# Patient Record
Sex: Male | Born: 1960 | Race: White | Hispanic: No | Marital: Single | Smoking: Current some day smoker
Health system: Southern US, Community
[De-identification: ages and names within clinical notes are randomized; demographics above are authoritative.]

## PROBLEM LIST (undated history)

## (undated) DIAGNOSIS — J45909 Unspecified asthma, uncomplicated: Secondary | ICD-10-CM

## (undated) DIAGNOSIS — G4730 Sleep apnea, unspecified: Secondary | ICD-10-CM

## (undated) DIAGNOSIS — E119 Type 2 diabetes mellitus without complications: Secondary | ICD-10-CM

## (undated) DIAGNOSIS — M1990 Unspecified osteoarthritis, unspecified site: Secondary | ICD-10-CM

## (undated) DIAGNOSIS — I2510 Atherosclerotic heart disease of native coronary artery without angina pectoris: Secondary | ICD-10-CM

## (undated) DIAGNOSIS — I219 Acute myocardial infarction, unspecified: Secondary | ICD-10-CM

## (undated) DIAGNOSIS — I10 Essential (primary) hypertension: Secondary | ICD-10-CM

## (undated) DIAGNOSIS — J449 Chronic obstructive pulmonary disease, unspecified: Secondary | ICD-10-CM

## (undated) HISTORY — PX: CORONARY ARTERY BYPASS GRAFT: SHX141

---

## 2005-04-29 ENCOUNTER — Inpatient Hospital Stay: Payer: Self-pay | Admitting: Internal Medicine

## 2005-04-29 ENCOUNTER — Other Ambulatory Visit: Payer: Self-pay

## 2005-04-30 ENCOUNTER — Other Ambulatory Visit: Payer: Self-pay

## 2005-06-27 ENCOUNTER — Ambulatory Visit: Payer: Self-pay | Admitting: Internal Medicine

## 2010-06-23 ENCOUNTER — Inpatient Hospital Stay: Payer: Self-pay | Admitting: Internal Medicine

## 2011-06-27 ENCOUNTER — Emergency Department: Payer: Self-pay | Admitting: Emergency Medicine

## 2011-06-27 LAB — COMPREHENSIVE METABOLIC PANEL
Alkaline Phosphatase: 44 U/L — ABNORMAL LOW (ref 50–136)
Anion Gap: 8 (ref 7–16)
Co2: 27 mmol/L (ref 21–32)
Creatinine: 1.05 mg/dL (ref 0.60–1.30)
EGFR (African American): >60
EGFR (Non-African Amer.): >60
Glucose: 117 mg/dL — ABNORMAL HIGH (ref 65–99)
Osmolality: 283 (ref 275–301)
SGOT(AST): 17 U/L (ref 15–37)
SGPT (ALT): 22 U/L
Sodium: 141 mmol/L (ref 136–145)

## 2011-06-27 LAB — CBC
HCT: 44.4 % (ref 40.0–52.0)
MCH: 32.3 pg (ref 26.0–34.0)
Platelet: 221 10*3/uL (ref 150–440)
RDW: 12.4 % (ref 11.5–14.5)
WBC: 7.1 10*3/uL (ref 3.8–10.6)

## 2011-06-27 LAB — TROPONIN I: Troponin-I: <0.02 ng/mL

## 2011-09-04 ENCOUNTER — Emergency Department: Payer: Self-pay | Admitting: *Deleted

## 2011-09-04 LAB — BASIC METABOLIC PANEL
Anion Gap: 5 — ABNORMAL LOW (ref 7–16)
Co2: 29 mmol/L (ref 21–32)
EGFR (African American): >60
Glucose: 188 mg/dL — ABNORMAL HIGH (ref 65–99)
Osmolality: 280 (ref 275–301)
Sodium: 138 mmol/L (ref 136–145)

## 2011-09-04 LAB — CBC
HCT: 43.8 % (ref 40.0–52.0)
MCH: 32.1 pg (ref 26.0–34.0)
MCV: 95 fL (ref 80–100)
RBC: 4.64 10*6/uL (ref 4.40–5.90)
RDW: 12.9 % (ref 11.5–14.5)
WBC: 6.6 10*3/uL (ref 3.8–10.6)

## 2011-09-04 LAB — TROPONIN I: Troponin-I: <0.02 ng/mL

## 2011-09-04 LAB — CK TOTAL AND CKMB (NOT AT ARMC): CK, Total: 112 U/L (ref 35–232)

## 2011-09-04 LAB — PRO B NATRIURETIC PEPTIDE: B-Type Natriuretic Peptide: 92 pg/mL (ref 0–125)

## 2012-06-11 IMAGING — CT CT CHEST W/ CM
1 series · 15 of 33 positions shown, 19 images · IV contrast (APPLIED)
Comparison: none

REASON FOR EXAM: hemoptysis, dyspnea, cough
COMMENTS:

[Series 7: soft tissue · axial · 0.82mm/px · z∈[-437,-137]mm · 15 of 118 slices shown, 19 images]
[im 9/118  mediastinal]
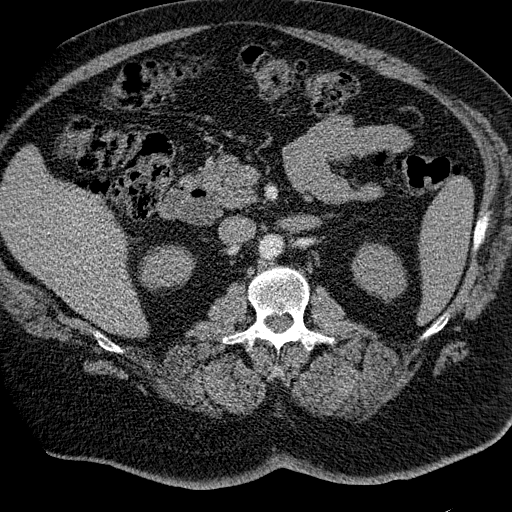
[im 9/118  lung]
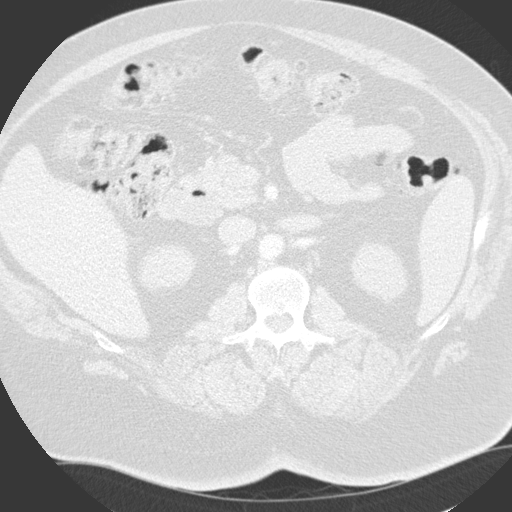
[im 18/118  lung]
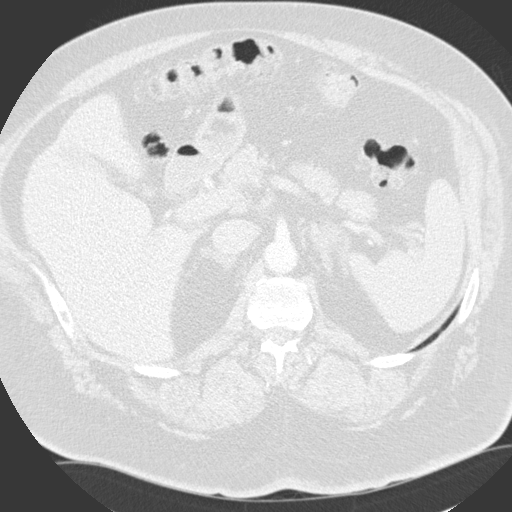
[im 24/118  lung]
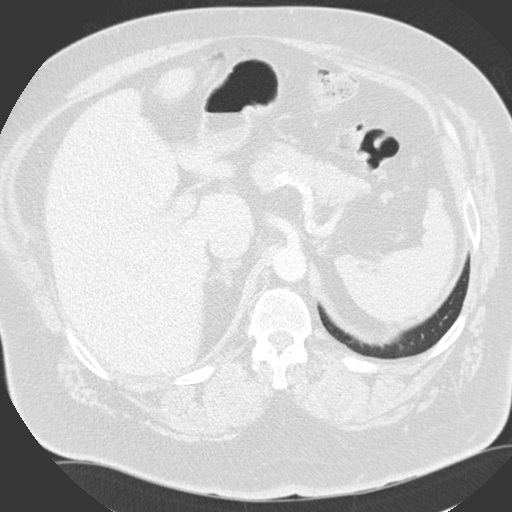
[im 31/118  lung]
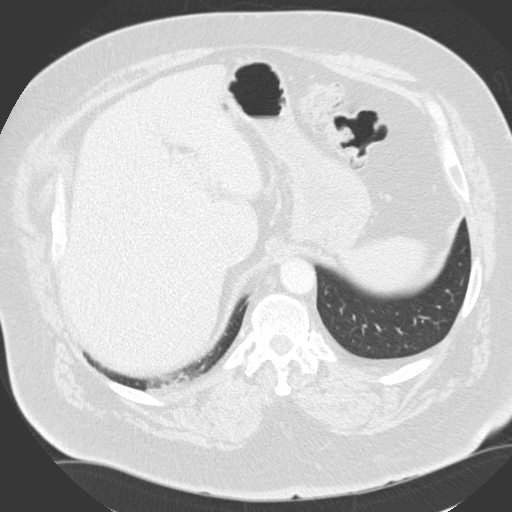
[im 40/118  mediastinal]
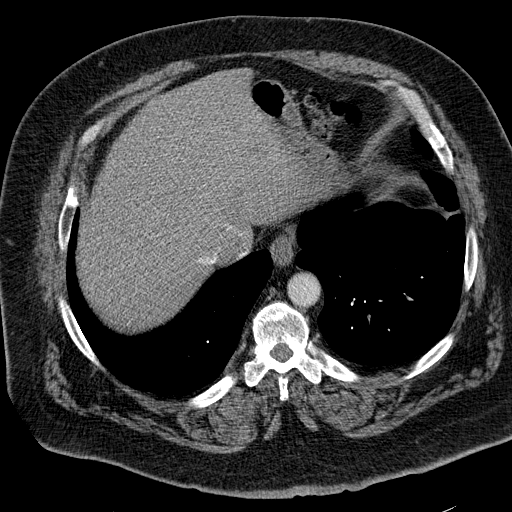
[im 40/118  lung]
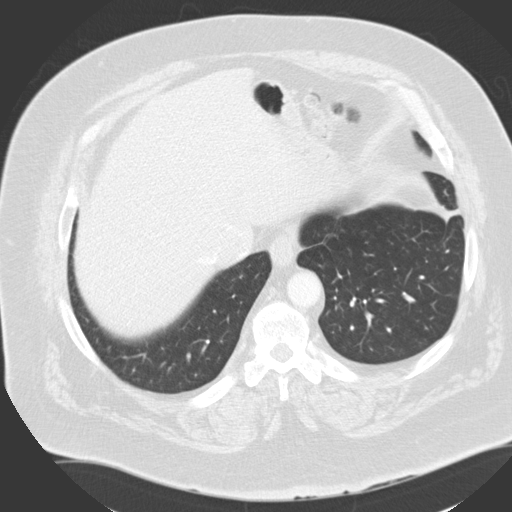
[im 47/118  lung]
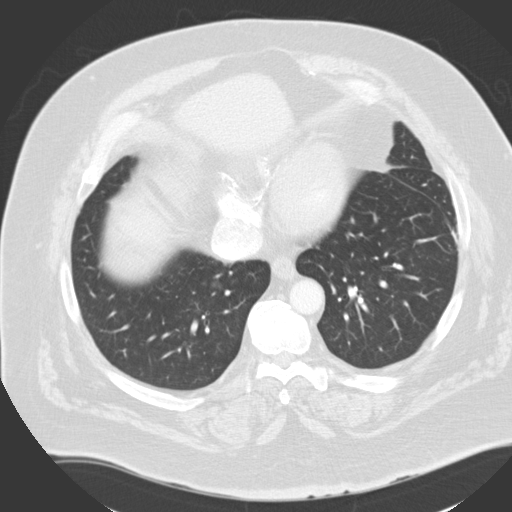
[im 53/118  lung]
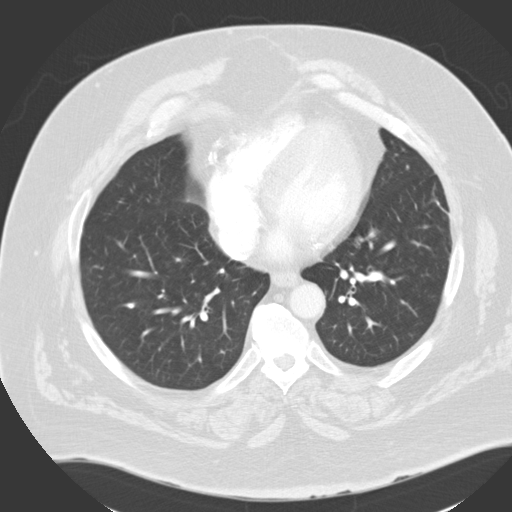
[im 61/118  lung]
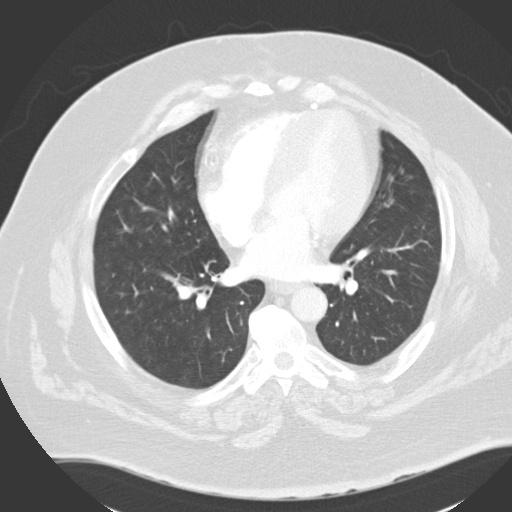
[im 66/118  mediastinal]
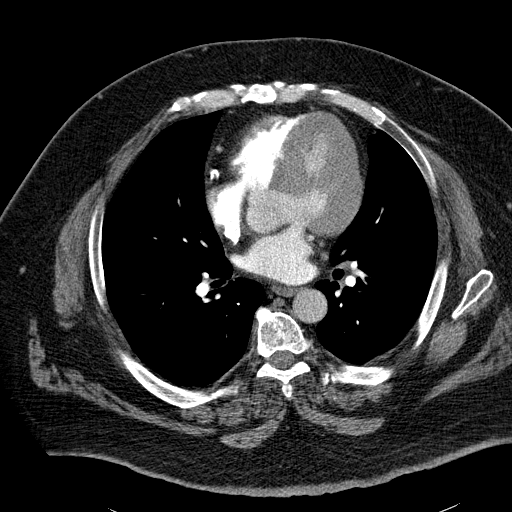
[im 66/118  lung]
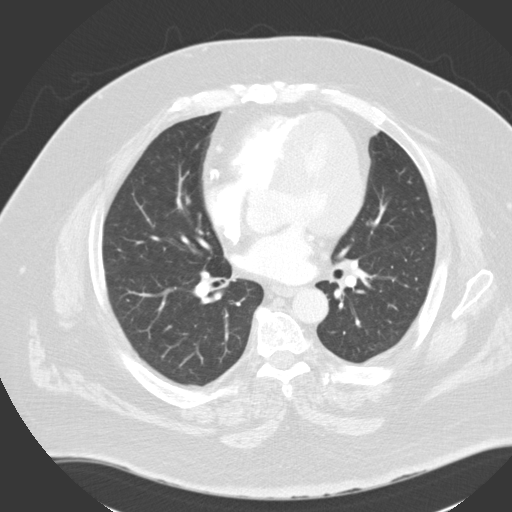
[im 71/118  lung]
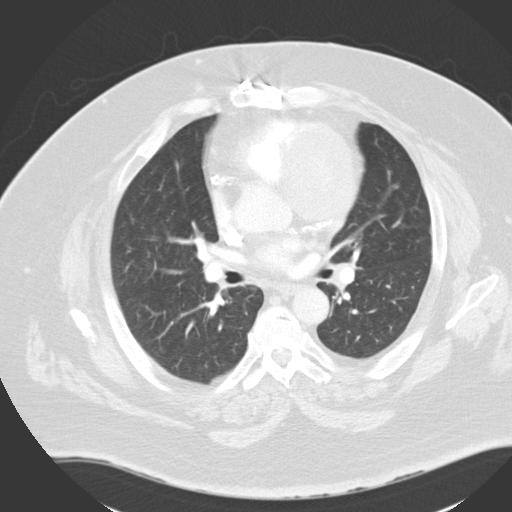
[im 79/118  lung]
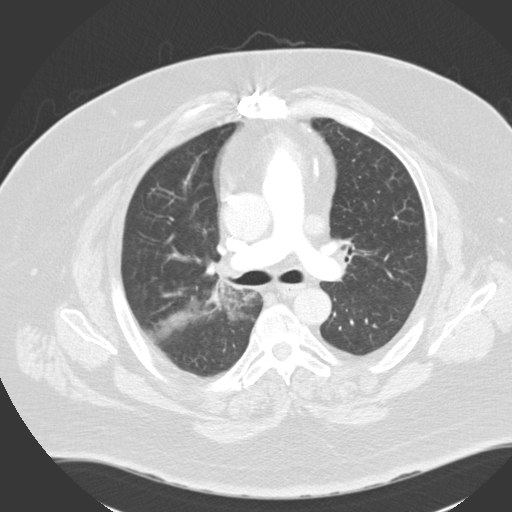
[im 87/118  lung]
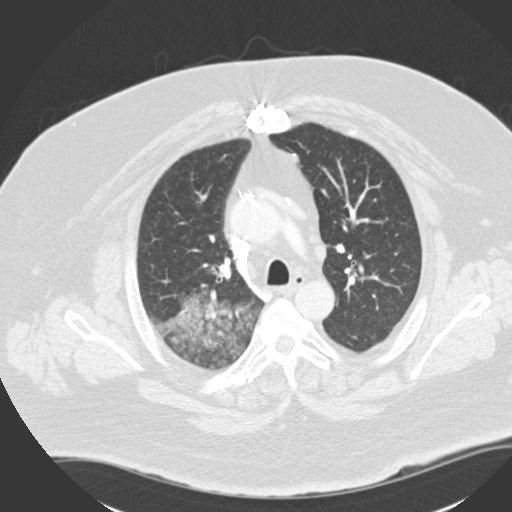
[im 94/118  mediastinal]
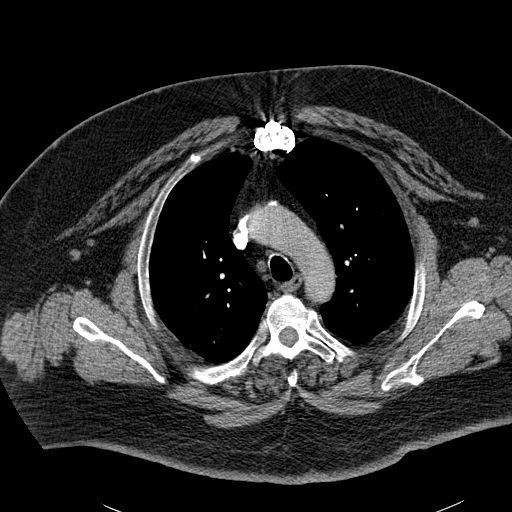
[im 94/118  lung]
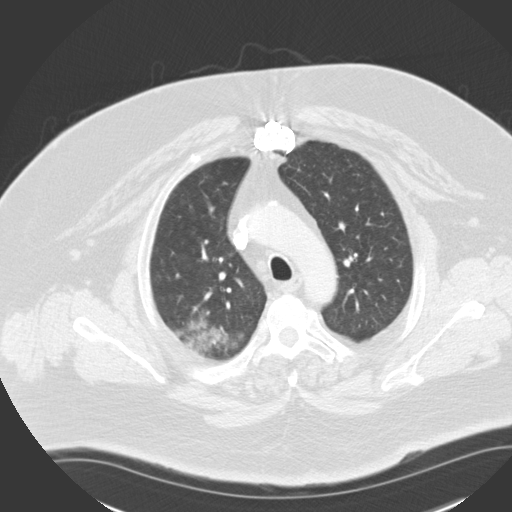
[im 100/118  lung]
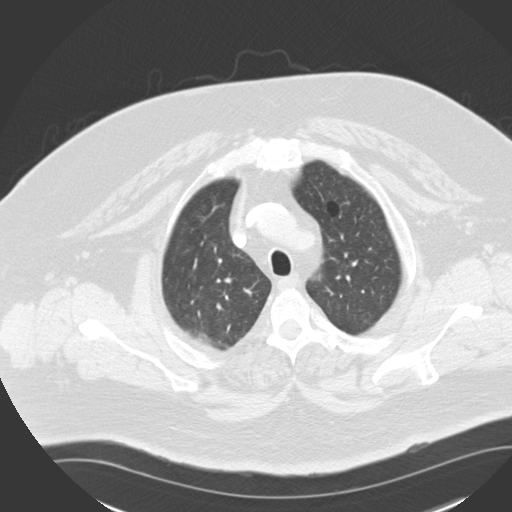
[im 109/118  lung]
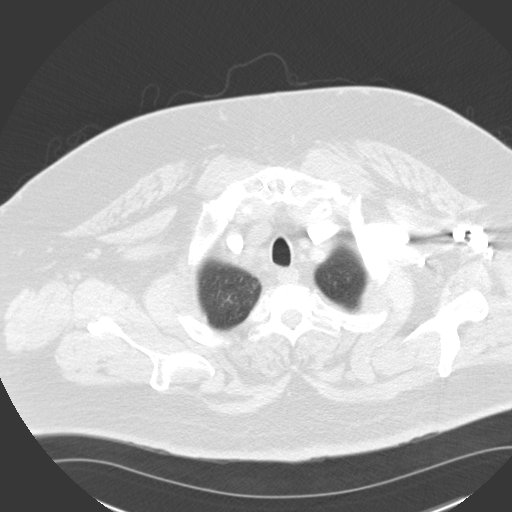

[15 of 33 positions shown; findings below may reference images not displayed]

PROCEDURE:     CT  - CT CHEST (FOR PE) W  - June 28, 2011  [DATE]

RESULT:     CT scan of the chest is performed utilizing 100 mL of Dsovue-RLI
iodinated intravenous contrast with images reconstructed at 3.0 mm slice
thickness in the axial plane utilizing a CTA pulmonary arterial technique.
Multiplanar thin slice reconstructions are created with [employer] software
for interpretation.

The thoracic aorta appears normal in caliber without a definite dissection.
The pulmonary arterial system is well opacified without a filling defect to
suggest pulmonary thromboembolic disease. No pleural or pericardial effusion
is evident. There is no mediastinal or hilar mass or mediastinal, axillary
or supraclavicular adenopathy evident. The included upper abdominal
structures appear within normal limits. The gallbladder is nondistended.

Lung window images demonstrate underlying emphysematous lung disease. There
is patchy density in the right upper lobe with ground-glass appearance
suggestive of mild pneumonia. No discrete pulmonary mass is evident. No
endobronchial lesion is evident. The esophagus appears to be grossly normal.
The bony structures appear within normal limits. Sternotomy wires are
present.
IMPRESSION: 1.     No evidence of pulmonary thromboembolic disease. No thoracic aortic
aneurysm evident.
2.     Air space disease in the right upper lobe inferior aspect posteriorly.

[REDACTED]

## 2013-10-01 ENCOUNTER — Ambulatory Visit: Payer: Self-pay | Admitting: Family Medicine

## 2014-05-23 ENCOUNTER — Observation Stay: Payer: Self-pay | Admitting: Internal Medicine

## 2014-07-03 NOTE — Consult Note (Signed)
PATIENT NAME:  Wesley Jordan, Wesley Jordan MR#:  [id number] DATE OF BIRTH:  [birth year]  DATE OF CONSULTATION:  05/24/2014  REFERRING PHYSICIAN:   CONSULTING PHYSICIAN:  Alinda SierrasEileen A. Jarold Mottoomano, PA-C  INDICATION FOR CONSULTATION: Palpitations.   HISTORY OF PRESENT ILLNESS: This is a 54 year old Caucasian male with past medical history of coronary artery disease status post 7 vessel CABG who presented to the ER yesterday complaining of palpitations. He states the palpitations and tachycardia started yesterday while at work, were unrelieved by sublingual nitro, accompanied by left shoulder pain, but no chest pain, pressure or tightness, and he presented to the ER. He denies any chest pain, pressure, tightness, shortness of breath at this time, just tachycardia and left shoulder pain. Given his cardiac history he was admitted for observation. He states tachycardia and left shoulder pain have since resolved. Still denies any chest pain or shortness of breath.   CURRENT MEDICATIONS: Aspirin 81 mg daily, carvedilol 25 mg b.i.d., Chantix continuing month pack, cyclobenzaprine 10 mg daily, isosorbide 30 mg daily, lisinopril/hydrochlorothiazide 20/12.5 b.i.d., metformin 500 mg b.i.d., Plavix 75 mg daily, Prozac 20 mg daily, Q-var 80 mcg use as directed, Wellbutrin 150 mg b.i.d.   PAST MEDICAL HISTORY: Recent CTA of coronaries March 2nd of 2016 showed calcium score of 6700, right dominant patent LIMA to LAD and SVG to PDA, occluded distal RCA, patent LAD, left circumflex diffusely calcified with moderate to severe disease in left circumflex, hypertension, hyperlipidemia, diabetes mellitus, morbid obesity, asthma.   SOCIAL HISTORY: Greater than a 50 pack-year smoking history. The patient is trying to quit smoking presently. No alcohol or other illicit drug use.   ALLERGIES: No known drug allergies.   FAMILY HISTORY: Paternal pancreatic cancer and diabetes.  REVIEW OF SYSTEMS: SYSTEMIC: No fatigue or malaise.  CARDIOVASCULAR:  No chest pain, palpitations or tachycardia.  PULMONARY: No dyspnea, cough or wheezing.  GASTROINTESTINAL: No abdominal pain, nausea, vomiting, or heartburn.   PHYSICAL EXAMINATION: VITAL SIGNS: Temperature 98.3 pulse 65, respirations 20, blood pressure 148/96, pulse ox 98% saturation on room air.  GENERAL: Alert and oriented x3, in no acute distress.  LUNGS: Clear to auscultation bilaterally. No wheezes, rhonchi, rales, or crackles.  CARDIOVASCULAR: Normal S1, S2. No audible murmur. No JVD or carotid bruit.  ABDOMEN: Soft, nontender. Positive bowel sounds.   PERTINENT DIAGNOSTIC DATA: Creatinine 0.92. Troponin 0.3, 0.4. TSH 0.9. WBC 7.4.   EKG shows normal sinus rhythm, 84 beats per minute, borderline first degree AV block, PVCs, nonspecific ST and T changes.   ASSESSMENT AND PLAN: Coronary artery disease status post CABG: Troponin is negative. No EKG changes. Advise changing carvedilol to metoprolol tartrate 50 mg b.i.d. Since the patient was recently evaluated, we are comfortable letting him go home with follow-up March 29th at 2:30 p.m.   Thank you very much for this consult.   ____________________________ Alinda SierrasEileen A. Margarito CourserRomano, PA-C ear:sb D: 05/24/2014 09:17:54 ET T: 05/24/2014 09:36:39 ET JOB#: [id number]  cc: Marjean DonnaEileen A. Margarito Courseromano, PA-C, <Dictator> Alinda SierrasEILEEN A Ruthene Methvin PA ELECTRONICALLY SIGNED 06/03/2014 13:45

## 2014-07-03 NOTE — H&P (Signed)
PATIENT NAME:  Wesley Jordan, [name] MR#:  [id number] DATE OF BIRTH:  [birth year]  DATE OF ADMISSION:  05/23/2014  PRIMARY CARE PHYSICIAN:  Heidi Dachebecca O. Darliss Cheneyrendorff, MD    REFERRING PHYSICIAN: Phineas SemenGraydon Goodman, MD  CHIEF COMPLAINT:  Palpitations today.   HISTORY OF PRESENT ILLNESS: A 54 year old Caucasian male with a history of hypertension, diabetes and OSA, presented in the ED with palpitations today. The patient is alert, awake, oriented, in no acute distress.  The patient recently had palpitation. The patient had palpitation during work today which has been worsening, so he came to the ED for further evaluation. In addition, the patient also complains of the left shoulder pain, neck tingling and facial pain on left side, but the patient denies any chest pain, fever or chills. Denies any cough or sputum. The patient recently had a stress test, which was abnormal, so he got a CT angio of the chest, about 2 weeks ago, which showed some vessel stenosis but Dr. Welton FlakesKhan did not have recommend cardiac cath at that time.    PAST MEDICAL HISTORY: Hypertension, diabetes, obstructive sleep apnea, GI bleeding, possibly due to diverticulosis 4 years ago, morbid obesity, hyperlipidemia, CAD status post CABG in 2007, GERD.    SOCIAL HISTORY: The patient smokes 1 pack a day for many years. Quit smoking after CABG but resumed smoking for the past 1 years at 1 pack a day. Denies any alcohol drinking and any illicit drugs.   PAST SURGICAL HISTORY: CABG.   FAMILY HISTORY: Heart disease, hypertension and cancer.   ALLERGIES: LIPITOR WHICH CAUSE MUSCLE ACHES, AND ALSO HAS ALLERGIES TO PENICILLIN.   HOME MEDICATIONS:  Vitamin E 400 international units 2 capsules once a day, vitamin E 400 international units 1 cap once a day at bedtime, Ventolin HFA CFC free 90 mcg inhalation 2 puffs inhaled every 4 hours p.r.n., Tizanidine 4 mg p.o. t.i.d. Nitrostat 0.4 mg sublingual 1 tablet every 5 minutes p.r.n. for chest pain, naproxen 50 mg  p.o. b.i.d. and multivitamin 1 tablet once a day, metformin 500 mg p.o. b.i.d. Lipitor 30 mg p.o. once a day, hydrochlorothiazide-lisinopril 12.5 mg/20 mg p.o. b.i.d, fluoxetine 20 mg p.o. daily in the morning, fish oil 1 cap once a day at bedtime and 2 capsules once a day in the morning, doxazosin 1 mg p.o. 1 tablet once a day at bedtime, cyclobenzaprine 10 mg p.o. at bedtime p.r.n. Crestor 40 mg p.o. at bedtime, Coreg 25 mg p.o. b.i.d., Plavix 75 mg p.o. daily Benadryl 20 mg p.o. 2 capsules once a day at bedtime p.r.n., aspirin 81 mg p.o. daily.   REVIEW OF SYSTEMS: CONSTITUTIONAL: The patient denies any fever or chills. No headache or dizziness or weakness.  EYES: No double vision or blurred vision.  EARS, NOSE, AND THROAT: No postnasal drip, slurred speech or dysphagia.  CARDIOVASCULAR: No chest pain, but has palpitations. No orthopnea, nocturnal dyspnea. No leg edema.  PULMONARY: No cough, sputum, shortness of breath, or hematemesis.  GASTROINTESTINAL: No abdominal pain, nausea, vomiting, diarrhea. No melena, bloody stool.  GENITOURINARY: No dysuria, hematuria, or incontinence.  SKIN: No rash or jaundice.  NEUROLOGIC: No syncope, loss of consciousness, or seizure.  ENDOCRINE: No polyuria, polydipsia, heat or cold intolerance.  HEMATOLOGY: No easy bleeding or bleeding.   PHYSICAL EXAMINATION:  VITAL SIGNS: Temperature 98.7, blood pressure 158/99, pulse 69, oxygen saturation 99% on room air.  GENERAL: The patient is alert, awake, oriented, in no acute distress.  HEENT: Pupils round, equal and reactive to light and  accommodation. Moist oral mucosa. Clear oropharynx.  NECK: Supple. No JVD or carotid bruit. No lymphadenopathy. No bruits. No thyromegaly.  CARDIOVASCULAR: S1 and S2. Regular rate and rhythm. No murmurs or gallops.  PULMONARY: Bilateral air entry. No wheezing or rales. No use of accessory muscle to breathe. Soft, obese bowel sounds present. No distention. No tenderness. No  organomegaly.  EXTREMITIES: No edema, clubbing or cyanosis. No calf tenderness but has pedal pulses present.  SKIN: No rash or jaundice.  NEUROLOGY: A and O x 3.  No focal deficit.  Power 6/6. Sensation intact.   LABORATORY DATA:  Glucose 150, BUN 16, creatinine 0.94. Electrolytes are normal except potassium of 3.2. Troponin less than 0.03 twice. CBC in normal range. Chest x-ray showed cardiac enlargement, underlying emphysema. No edema or consolidation. EKG showed normal sinus rhythm at 84 beats per minute with first-degree AV block.   IMPRESSIONS:  1. Palpitation with atypical chest pain.  2.  Hypertension.  3.  Diabetes.  4.  Coronary artery disease.  5.  Morbid obesity.  6.  Hyperlipidemia.  7.  Obstructive sleep apnea.  8.  Hypokalemia.    PLAN OF TREATMENT:   1.  The patient will be placed for observation. We will continue aspirin, Plavix, and Crestor and follow up a troponin level. We will get a cardiology consult from Dr. Welton Flakes.  2.  For hypertension, continue the patient home hypertension medication.  3.  For diabetes, we will start a sliding scale.  4.  I discussed the patient's condition.  5.  For tobacco abuse, the patient was counseled for smoking cessation for 3-4 minutes.  We will give a nicotine patch.  6.  I discussed the patient's condition and plan of treatment with the patient,    CODE STATUS:  FULL CODE.   TIME SPENT: About 55 minutes.     ____________________________ Shaune Pollack, MD qc:DT D: 05/23/2014 16:57:06 ET T: 05/23/2014 17:27:16 ET JOB#: [id number]  cc: Shaune Pollack, MD, <Dictator> Heidi Dach. Darliss Cheney, MD Shaune Pollack MD ELECTRONICALLY SIGNED 05/23/2014 20:41

## 2014-07-03 NOTE — Discharge Summary (Signed)
PATIENT NAME:  Wesley Jordan, Wesley Jordan MR#:  [id number] DATE OF BIRTH:  1960-11-12  DATE OF ADMISSION:  05/23/2014 DATE OF DISCHARGE:  05/24/2014  DISCHARGE DIAGNOSES:  1.  Palpitations and atypical chest pain likely due to chronic cough from chronic obstructive pulmonary disease.  2.  Coronary artery disease with history of bypass surgery.  3.  Hypertension.  4.  Hyperlipidemia.  5.  Type 2 diabetes mellitus. 6.  Tobacco abuse.   MEDICATIONS:  1.  HCTZ with lisinopril 12.5/20 mg 1 tablet p.o. b.i.d.  2.  Nitrostat 0.4 mg sublingual every 5 minutes p.r.n. for chest pain.  3.  Aspirin 81 mg daily.  4.  Benadryl 25 mg 2 capsules once a day at bedtime.  5.  Fish oil 2 capsules once a day.  6.  Vitamin E 400 international units once a day.  7.  Crestor 40 mg p.o. daily.  8.  Fluoxetine 20 mg p.o. daily.  9.  Plavix 75 mg p.o. daily.  10.  Imdur 30 mg p.o. daily.  11. tizanidine 4 mg p.o. t.i.d.  12.  Naproxen 150 mg 1 tablet p.o. b.i.d.  13.  Metformin 5 mg p.o. b.i.d.  14.  Doxazosin 1 mg p.o. daily.  15.  Flexeril 10 mg p.o. at bedtime.  16.  Ventolin 90 mcg 2 puffs every 4 hours as needed.  17.  Fish oil 1 capsule at bedtime.  18.  Vitamin E 400 international units 2 capsules once a day.  19.  Metoprolol tartrate 50 mg q. 12 hours. Note that metoprolol is a new medicine. The patient advised to stop Coreg and take metoprolol.   DIET: Low sodium, low fat, ADA diet.   CONSULTATIONS: Cardiology consult with Dr. Adrian BlackwaterShaukat Khan.   HOSPITAL COURSE:  A 54 year old obese male with history of coronary artery disease status post bypass surgery, hypertension, hyperlipidemia, morbid obesity, diabetes, comes in because of palpitations. The patient noted to have left shoulder pain, neck pain not relieved with the nitroglycerin. The patient sees Dr. Adrian BlackwaterShaukat Khan on a regular basis. Admitted to observation status for palpitations and rule out for acute MI. The patient continued on his home medications. The  patient did not have any EKG changes. His heart rate was normal at 84.  EKG showed normal sinus rhythm 84 beats per minute, first degree AV block.  The patient was seen by Dr. Adrian BlackwaterShaukat Khan.  He suggested that the patient's Coreg is changed to metoprolol 50 mg for his palpitations and symptomatic improvement. The patient admitted to observation status because of his cardiac history to chest pain rule out.  His troponins have been negative at 0.04 and he felt better the follow day and he wanted to go home. I advised him to take metoprolol and stop the Coreg.  The patient does have an appointment with Dr. Park BreedKahn on March 29.  DISCHARGE VITAL SIGNS: Temperature 98.8, heart rate 66, blood pressure 168/90, saturation 95% on room air.   PHYSICAL EXAMINATION: CARDIOVASCULAR: S1, S2 regular.  LUNGS: Clear to auscultation. No wheezing.  ABDOMEN: Soft, obese. Bowel sounds present.   The patient's condition is stable.  He  has morbid obesity with BMI 53.3. Advised him to lose weight and he will see Dr. Welton FlakesKhan.  TIME SPENT: More than 30 minutes.   ____________________________ Katha HammingSnehalatha Wilbur [place], MD sk:sp D: 05/25/2014 10:24:31 ET T: 05/25/2014 15:22:44 ET JOB#: [id number]  cc: Katha HammingSnehalatha Isolde Skaff, MD, <Dictator> Laurier NancyShaukat A. Khan, MD Katha HammingSNEHALATHA Dorwin Fitzhenry MD ELECTRONICALLY SIGNED 05/26/2014 13:12

## 2015-01-16 ENCOUNTER — Other Ambulatory Visit
Admission: RE | Admit: 2015-01-16 | Discharge: 2015-01-16 | Disposition: A | Discharge status: Home / Self Care | Payer: Managed Care, Other (non HMO) | Source: Ambulatory Visit | Attending: Cardiovascular Disease | Admitting: Cardiovascular Disease

## 2015-01-16 ENCOUNTER — Other Ambulatory Visit: Admit: 2015-01-16 | Payer: Self-pay

## 2015-01-16 DIAGNOSIS — Z029 Encounter for administrative examinations, unspecified: Secondary | ICD-10-CM | POA: Insufficient documentation

## 2015-01-16 LAB — BASIC METABOLIC PANEL
Anion gap: 3 — ABNORMAL LOW (ref 5–15)
BUN: 15 mg/dL (ref 6–20)
CHLORIDE: 108 mmol/L (ref 101–111)
CO2: 28 mmol/L (ref 22–32)
Calcium: 9.1 mg/dL (ref 8.9–10.3)
Creatinine, Ser: 1.09 mg/dL (ref 0.61–1.24)
GFR calc Af Amer: >60 mL/min (ref >60)
GLUCOSE: 78 mg/dL (ref 65–99)
Potassium: 3.5 mmol/L (ref 3.5–5.1)
Sodium: 139 mmol/L (ref 135–145)

## 2015-01-16 LAB — CBC
HEMATOCRIT: 42.1 % (ref 40.0–52.0)
Hemoglobin: 14.1 g/dL (ref 13.0–18.0)
MCH: 31.2 pg (ref 26.0–34.0)
MCHC: 33.5 g/dL (ref 32.0–36.0)
MCV: 93 fL (ref 80.0–100.0)
Platelets: 217 10*3/uL (ref 150–440)
RBC: 4.52 MIL/uL (ref 4.40–5.90)
RDW: 12.9 % (ref 11.5–14.5)
WBC: 7 10*3/uL (ref 3.8–10.6)

## 2015-01-16 LAB — PROTIME-INR
INR: 1.15
Prothrombin Time: 14.9 seconds (ref 11.4–15.0)

## 2015-01-17 ENCOUNTER — Encounter: Payer: Self-pay | Admitting: Certified Registered Nurse Anesthetist

## 2015-01-17 ENCOUNTER — Encounter
Admission: AD | Disposition: A | Discharge status: Home / Self Care | Payer: Self-pay | Source: Ambulatory Visit | Attending: Cardiovascular Disease

## 2015-01-17 ENCOUNTER — Encounter: Payer: Self-pay | Admitting: *Deleted

## 2015-01-17 ENCOUNTER — Inpatient Hospital Stay
Admission: AD | Admit: 2015-01-17 | Discharge: 2015-01-17 | DRG: 287 | Disposition: A | Discharge status: Home / Self Care | Payer: Managed Care, Other (non HMO) | Source: Ambulatory Visit | Attending: Cardiovascular Disease | Admitting: Cardiovascular Disease

## 2015-01-17 DIAGNOSIS — Z951 Presence of aortocoronary bypass graft: Secondary | ICD-10-CM | POA: Diagnosis not present

## 2015-01-17 DIAGNOSIS — I2510 Atherosclerotic heart disease of native coronary artery without angina pectoris: Principal | ICD-10-CM | POA: Diagnosis present

## 2015-01-17 DIAGNOSIS — I2582 Chronic total occlusion of coronary artery: Secondary | ICD-10-CM | POA: Diagnosis present

## 2015-01-17 DIAGNOSIS — I249 Acute ischemic heart disease, unspecified: Secondary | ICD-10-CM | POA: Diagnosis present

## 2015-01-17 HISTORY — DX: Unspecified asthma, uncomplicated: J45.909

## 2015-01-17 HISTORY — DX: Acute myocardial infarction, unspecified: I21.9

## 2015-01-17 HISTORY — PX: CARDIAC CATHETERIZATION: SHX172

## 2015-01-17 HISTORY — DX: Sleep apnea, unspecified: G47.30

## 2015-01-17 HISTORY — DX: Unspecified osteoarthritis, unspecified site: M19.90

## 2015-01-17 HISTORY — DX: Chronic obstructive pulmonary disease, unspecified: J44.9

## 2015-01-17 HISTORY — DX: Atherosclerotic heart disease of native coronary artery without angina pectoris: I25.10

## 2015-01-17 HISTORY — DX: Type 2 diabetes mellitus without complications: E11.9

## 2015-01-17 HISTORY — DX: Essential (primary) hypertension: I10

## 2015-01-17 SURGERY — LEFT HEART CATH
Anesthesia: Moderate Sedation | Laterality: Left

## 2015-01-17 MED ORDER — LABETALOL HCL 5 MG/ML IV SOLN
INTRAVENOUS | Status: AC
  Filled 2015-01-17: qty 4

## 2015-01-17 MED ORDER — HYDRALAZINE HCL 20 MG/ML IJ SOLN
10.0000 mg | Freq: Once | INTRAMUSCULAR | Status: AC
  Administered 2015-01-17: 10 mg via INTRAVENOUS

## 2015-01-17 MED ORDER — SODIUM CHLORIDE 0.9 % IJ SOLN: 3.0000 mL | Freq: Two times a day (BID) | INTRAMUSCULAR | Status: DC

## 2015-01-17 MED ORDER — SODIUM CHLORIDE 0.9 % IV SOLN: 250.0000 mL | INTRAVENOUS | Status: DC | PRN

## 2015-01-17 MED ORDER — IOHEXOL 300 MG/ML  SOLN
INTRAMUSCULAR | Status: DC | PRN
  Administered 2015-01-17: 100 mL via INTRA_ARTERIAL
  Administered 2015-01-17: 40 mL via INTRA_ARTERIAL
  Administered 2015-01-17: 30 mL via INTRA_ARTERIAL

## 2015-01-17 MED ORDER — SODIUM CHLORIDE 0.9 % WEIGHT BASED INFUSION: 1.0000 mL/kg/h | INTRAVENOUS | Status: DC

## 2015-01-17 MED ORDER — HYDRALAZINE HCL 20 MG/ML IJ SOLN
INTRAMUSCULAR | Status: AC
  Filled 2015-01-17: qty 1

## 2015-01-17 MED ORDER — FENTANYL CITRATE (PF) 100 MCG/2ML IJ SOLN
INTRAMUSCULAR | Status: DC | PRN
  Administered 2015-01-17: 50 ug via INTRAVENOUS
  Administered 2015-01-17 (×2): 25 ug via INTRAVENOUS

## 2015-01-17 MED ORDER — SODIUM CHLORIDE 0.9 % IJ SOLN: 3.0000 mL | INTRAMUSCULAR | Status: DC | PRN

## 2015-01-17 MED ORDER — FENTANYL CITRATE (PF) 100 MCG/2ML IJ SOLN
INTRAMUSCULAR | Status: AC
  Filled 2015-01-17: qty 2

## 2015-01-17 MED ORDER — SODIUM CHLORIDE 0.9 % IV SOLN
INTRAVENOUS | Status: DC
  Administered 2015-01-17: 14:00:00 via INTRAVENOUS

## 2015-01-17 MED ORDER — ACETAMINOPHEN 325 MG PO TABS: 650.0000 mg | ORAL_TABLET | ORAL | Status: DC | PRN

## 2015-01-17 MED ORDER — ONDANSETRON HCL 4 MG/2ML IJ SOLN: 4.0000 mg | Freq: Four times a day (QID) | INTRAMUSCULAR | Status: DC | PRN

## 2015-01-17 MED ORDER — LABETALOL HCL 5 MG/ML IV SOLN
INTRAVENOUS | Status: DC | PRN
  Administered 2015-01-17 (×2): 20 mg via INTRAVENOUS

## 2015-01-17 MED ORDER — HEPARIN (PORCINE) IN NACL 2-0.9 UNIT/ML-% IJ SOLN
INTRAMUSCULAR | Status: AC
  Filled 2015-01-17: qty 1000

## 2015-01-17 SURGICAL SUPPLY — 14 items
CATH 5FR JB2 100CM (CATHETERS) ×3 IMPLANT
CATH INFINITI 5 FR IM (CATHETERS) ×3 IMPLANT
CATH INFINITI 5 FR MPA2 (CATHETERS) ×3 IMPLANT
CATH INFINITI 5FR ANG PIGTAIL (CATHETERS) ×3 IMPLANT
CATH INFINITI 5FR JL4 (CATHETERS) ×3 IMPLANT
CATH INFINITI 5FR JL5 (CATHETERS) ×3 IMPLANT
CATH INFINITI JR4 5F (CATHETERS) ×3 IMPLANT
DEVICE CLOSURE MYNXGRIP 5F (Vascular Products) ×3 IMPLANT
KIT MANI 3VAL PERCEP (MISCELLANEOUS) ×3 IMPLANT
NEEDLE PERC 18GX7CM (NEEDLE) ×3 IMPLANT
PACK CARDIAC CATH (CUSTOM PROCEDURE TRAY) ×3 IMPLANT
SHEATH PINNACLE 5F 10CM (SHEATH) ×3 IMPLANT
WIRE EMERALD 3MM-J .035X150CM (WIRE) ×3 IMPLANT
WIRE EMERALD 3MM-J .035X260CM (WIRE) ×3 IMPLANT

## 2015-01-17 NOTE — OR Nursing (Signed)
md made aware of SBP , new orders given

## 2015-01-17 NOTE — Discharge Instructions (Signed)

## 2015-01-18 ENCOUNTER — Encounter: Payer: Self-pay | Admitting: Cardiovascular Disease

## 2015-02-02 DEATH — deceased

## 2015-05-07 IMAGING — CR DG CHEST 1V PORT
1 series · 2 of 2 positions shown · non-contrast
Comparison: September 04, 2011

CLINICAL DATA: Chest pain radiating into left arm for 1 day

EXAM:
PORTABLE CHEST - 1 VIEW

[Series 1: ap · 0.17mm/px · 2 of 2 slices shown]
[im 1/2]
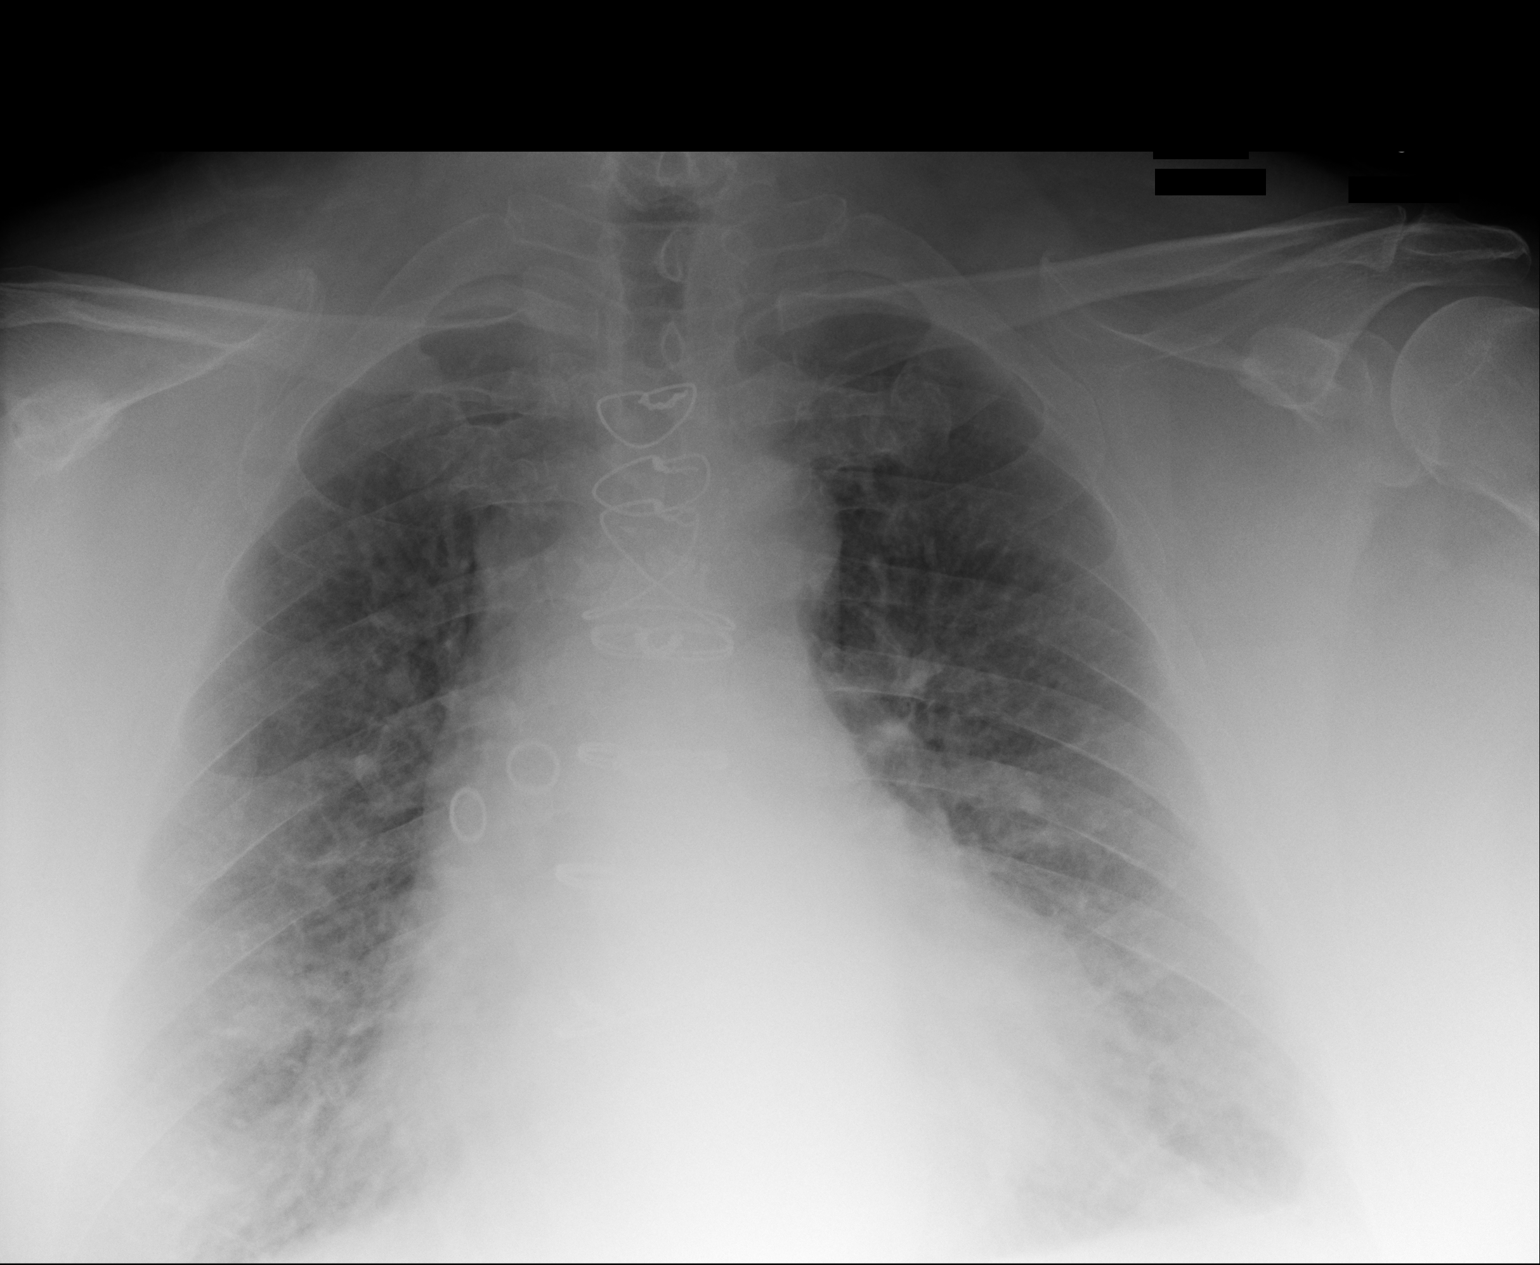
[im 2/2]
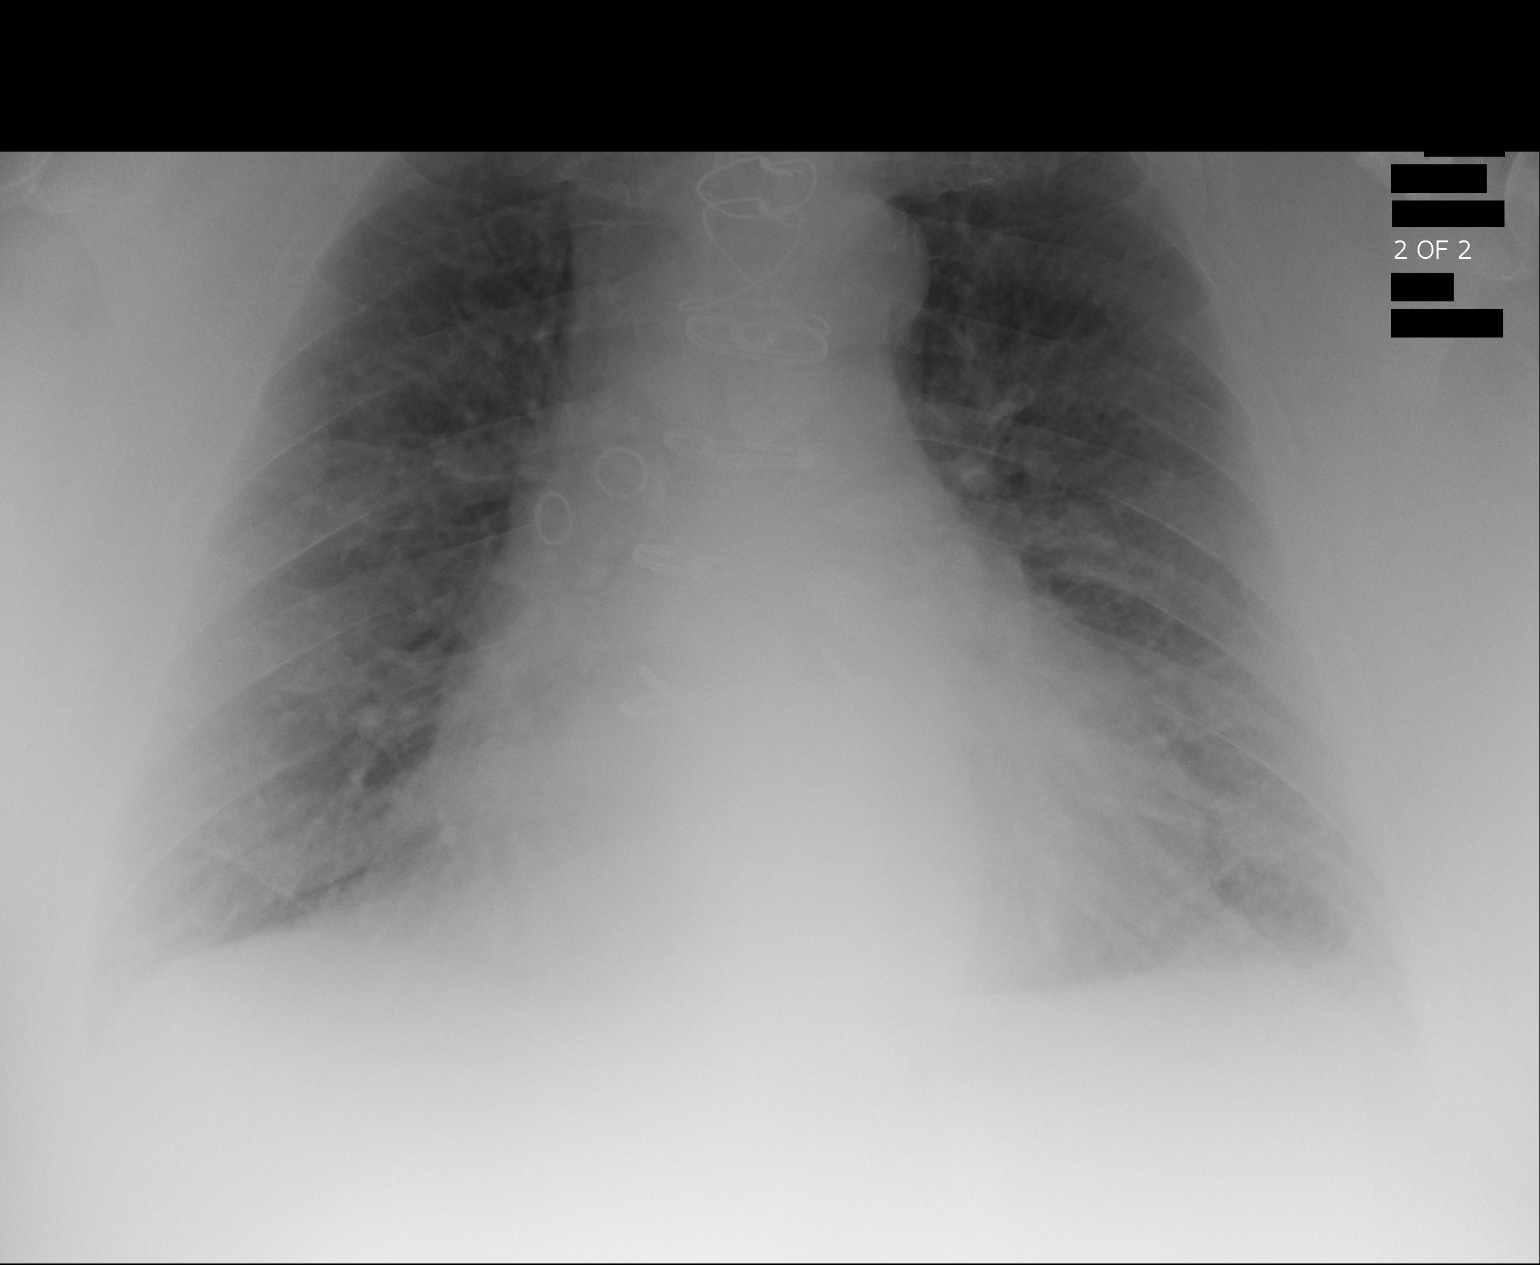

[2 of 2 positions shown; findings below may reference images not displayed]

FINDINGS: There is underlying emphysematous change. There is no appreciable
edema or consolidation. Heart is enlarged with pulmonary vascularity
within normal limits. No adenopathy. Patient is status post coronary
artery bypass grafting.
IMPRESSION: Cardiac enlargement. Underlying emphysema. No edema or
consolidation.
